# Patient Record
Sex: Male | Born: 1970 | Race: White | Hispanic: No | Marital: Single | Smoking: Current every day smoker
Health system: Southern US, Community
[De-identification: ages and names within clinical notes are randomized; demographics above are authoritative.]

## PROBLEM LIST (undated history)

## (undated) DIAGNOSIS — I10 Essential (primary) hypertension: Secondary | ICD-10-CM

## (undated) DIAGNOSIS — K219 Gastro-esophageal reflux disease without esophagitis: Secondary | ICD-10-CM

## (undated) HISTORY — DX: Essential (primary) hypertension: I10

## (undated) HISTORY — DX: Gastro-esophageal reflux disease without esophagitis: K21.9

---

## 2020-03-04 ENCOUNTER — Encounter: Payer: Self-pay | Admitting: Orthopaedic Surgery

## 2020-03-04 ENCOUNTER — Other Ambulatory Visit: Payer: Self-pay

## 2020-03-04 ENCOUNTER — Ambulatory Visit (INDEPENDENT_AMBULATORY_CARE_PROVIDER_SITE_OTHER): Payer: BC Managed Care – PPO | Admitting: Orthopaedic Surgery

## 2020-03-04 DIAGNOSIS — M25551 Pain in right hip: Secondary | ICD-10-CM
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
## 2020-04-21 ENCOUNTER — Encounter: Payer: Self-pay | Admitting: Orthopaedic Surgery

## 2020-04-21 ENCOUNTER — Other Ambulatory Visit: Payer: Self-pay

## 2020-04-21 ENCOUNTER — Ambulatory Visit (INDEPENDENT_AMBULATORY_CARE_PROVIDER_SITE_OTHER): Payer: BC Managed Care – PPO | Admitting: Orthopaedic Surgery

## 2020-04-21 VITALS — Ht 69.0 in | Wt 215.0 lb

## 2020-04-21 DIAGNOSIS — M25551 Pain in right hip: Secondary | ICD-10-CM

## 2020-04-21 NOTE — Progress Notes (Signed)
   Office Visit Note   Patient: Brian Gomez           Date of Birth: June 27, 1971           MRN: [id number] Visit Date: 04/21/2020              Requested by: Neale Burly, MD [place],  [place] [id number] PCP: Neale Burly, MD   Assessment & Plan: Visit Diagnoses:  1. Pain in right hip     Plan: Persistent pain in the right groin with prior x-rays demonstrating some early degenerative changes of the right hip.  Brian Gomez relates that the pain is interfering with his activities of daily living.  Had seen Dr. Lorin Mercy in March with a suggestion of an MRI scan of his right hip if his pain persist.  Will obtain the MRI scan and have him follow-up with Dr. Lorin Mercy.  I agree with Dr. Lorin Mercy that the problem seems to originate in his hip  Follow-Up Instructions: Return After MRI scan right hip.   Orders:  No orders of the defined types were placed in this encounter.  No orders of the defined types were placed in this encounter.     Procedures: No procedures performed   Clinical Data: No additional findings.   Subjective: Chief Complaint  Patient presents with  . Right Hip - Pain  Patient presents today for right hip pain. No known injury. He said that he has been having pain for six years. Worsening. His pain is located in his groin. He said that his hip will give way. He also states that he has some numbness and tingling in his thigh. He does have some lower back pain. He saw Dr.Yates on  03/04/2020 and states that he has not improved since that visit. He has pain that remains the same with rest or weightbearing. He is not taking anything for pain. He has had x-rays done prior to his visit with Dr.Yates at an urgent care. HPI  Review of Systems   Objective: Vital Signs: Ht 5\' 9"  (1.753 m)   Wt 215 lb (97.5 kg)   BMI 31.75 kg/m   Physical Exam  Ortho Exam awake alert and oriented x3.  Comfortable sitting.  Definite decreased range of motion of right hip compared  to the left but particularly with internal rotation.  Does have pain on the extreme of internal and external rotation.  No distal edema.  No pain with range of motion of the left hip.  Neurologically intact.  Has history of back pain but straight leg raise is negative and no percussible tenderness of his hip. Specialty Comments:  No specialty comments available.  Imaging: No results found.   PMFS History: Patient Active Problem List   Diagnosis Date Noted  . Pain in right hip 03/04/2020   Past Medical History:  Diagnosis Date  . GERD (gastroesophageal reflux disease)   . Hypertension     History reviewed. No pertinent family history.  History reviewed. No pertinent surgical history. Social History   Occupational History  . Not on file  Tobacco Use  . Smoking status: Current Every Day Smoker  . Smokeless tobacco: Never Used  Substance and Sexual Activity  . Alcohol use: Yes  . Drug use: Not on file  . Sexual activity: Not on file

## 2020-04-27 ENCOUNTER — Other Ambulatory Visit: Payer: Self-pay

## 2020-04-27 ENCOUNTER — Ambulatory Visit: Payer: BC Managed Care – PPO | Attending: Internal Medicine

## 2020-04-27 DIAGNOSIS — Z20822 Contact with and (suspected) exposure to covid-19: Secondary | ICD-10-CM

## 2020-04-28 LAB — SARS-COV-2, NAA 2 DAY TAT

## 2020-04-28 LAB — NOVEL CORONAVIRUS, NAA: SARS-CoV-2, NAA: NOT DETECTED

## 2020-05-20 ENCOUNTER — Telehealth: Payer: Self-pay | Admitting: Orthopaedic Surgery

## 2020-05-20 NOTE — Telephone Encounter (Signed)
Dr Lorin Mercy is out of the office. I called Out of work for the last 3 weeks. He states he dropped of papers at the [facility] office for his STD Patient is under doctors care and is out of work.

## 2020-05-20 NOTE — Telephone Encounter (Signed)
Pt called stating he needed Dr. Lorin Mercy to call him back regarding his work status.   [phone]

## 2020-05-20 NOTE — Telephone Encounter (Signed)
I spoke with Baldo Ash, she has the forms and is willing to fill them out however but needing clarification from provider.  Patient has been out of work for 3 weeks. Per notes in chart he was never taken out of work by Dr Lorin Mercy or by Dr Durward Fortes, only reduced hours. Per Baldo Ash, patients job would not accomodate these restrictions and told patient he would not be able to work until released to full duty.   Patient is scheduled for MRI on June 8 at [facility].  Not sure what Dr Lorin Mercy wants to do. I advised patient we would discuss with Dr Lorin Mercy and call him back to advise.

## 2020-05-24 NOTE — Telephone Encounter (Signed)
Please see below and advise. Patient originally saw you in the [place] office in March. He was seen again by Dr. Durward Fortes in May and a MRI was ordered. Dr. Durward Fortes wanted him to follow up with you. He was given a note with restrictions by Dr. Durward Fortes, however, his company cannot accommodate those. OK for out of work until follow up appt for MRI review?

## 2020-05-25 ENCOUNTER — Ambulatory Visit (HOSPITAL_COMMUNITY): Payer: BC Managed Care – PPO

## 2020-05-25 NOTE — Telephone Encounter (Signed)
I called and spoke with Brian Gomez in [place] office. She will have Brian Gomez fill out forms with patient being out of work until follow up in the office for MRI review.

## 2020-05-25 NOTE — Telephone Encounter (Signed)
Yes OK thank you. MRI scheduled today

## 2020-05-26 ENCOUNTER — Other Ambulatory Visit: Payer: Self-pay

## 2020-05-26 ENCOUNTER — Ambulatory Visit (HOSPITAL_COMMUNITY)
Admission: RE | Admit: 2020-05-26 | Discharge: 2020-05-26 | Disposition: A | Discharge status: Home / Self Care | Payer: BC Managed Care – PPO | Source: Ambulatory Visit | Attending: Orthopaedic Surgery | Admitting: Orthopaedic Surgery

## 2020-05-26 DIAGNOSIS — M25551 Pain in right hip: Secondary | ICD-10-CM | POA: Diagnosis present

## 2020-06-02 ENCOUNTER — Ambulatory Visit: Payer: BC Managed Care – PPO | Admitting: Orthopaedic Surgery

## 2020-06-03 ENCOUNTER — Other Ambulatory Visit: Payer: Self-pay

## 2020-06-03 ENCOUNTER — Ambulatory Visit (INDEPENDENT_AMBULATORY_CARE_PROVIDER_SITE_OTHER): Payer: BC Managed Care – PPO | Admitting: Orthopaedic Surgery

## 2020-06-03 ENCOUNTER — Encounter: Payer: Self-pay | Admitting: Orthopaedic Surgery

## 2020-06-03 VITALS — Ht 69.0 in | Wt 215.0 lb

## 2020-06-03 DIAGNOSIS — M25551 Pain in right hip: Secondary | ICD-10-CM | POA: Diagnosis not present

## 2020-06-03 NOTE — Progress Notes (Signed)
Office Visit Note   Patient: Brian Gomez           Date of Birth: Dec 10, 1971           MRN: [id number] Visit Date: 06/03/2020              Requested by: Neale Burly, MD [place],  [place] [id number] PCP: Neale Burly, MD   Assessment & Plan: Visit Diagnoses: No diagnosis found.  Plan: We will set patient up for work resumption on 06/12/2020 without restrictions.  I plan to recheck him in 4 months we discussed eventually he may require total hip arthroplasty.  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  Currently has maintained good joint space on previous radiographs.  If he is having increased symptoms on return we will obtain standing AP x-rays both hips weightbearing film.  Follow-Up Instructions: Return in about 4 months (around 10/03/2020).   Orders:  No orders of the defined types were placed in this encounter.  No orders of the defined types were placed in this encounter.     Procedures: No procedures performed   Clinical Data: No additional findings.   Subjective: Chief Complaint  Patient presents with  . Right Hip - Pain, Follow-up    MRI Review    HPI 49 year old male returns with ongoing problems with pain in his right hip.  MRI scan has been obtained which shows some degenerative changes with acetabular cysts several anteriorly.  There is no evidence of labral tear no evidence of AVN.  He states not working and is doing better.  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  Review of Systems all systems are negative other than as mentioned HPI.   Objective: Vital Signs: Ht 5\' 9"  (1.753 m)   Wt 215 lb (97.5 kg)   BMI 31.75 kg/m   Physical Exam Constitutional:      Appearance: He is well-developed.  HENT:     Head: Normocephalic and atraumatic.  Eyes:     Pupils: Pupils are equal, round, and reactive to light.  Neck:     Thyroid: No thyromegaly.     Trachea: No tracheal  deviation.  Cardiovascular:     Rate and Rhythm: Normal rate.  Pulmonary:     Effort: Pulmonary effort is normal.     Breath sounds: No wheezing.  Abdominal:     General: Bowel sounds are normal.     Palpations: Abdomen is soft.  Skin:    General: Skin is warm and dry.     Capillary Refill: Capillary refill takes less than 2 seconds.  Neurological:     Mental Status: He is alert and oriented to person, place, and time.  Psychiatric:        Behavior: Behavior normal.        Thought Content: Thought content normal.        Judgment: Judgment normal.     Ortho Exam patient has mild discomfort with internal/external rotation against [place] from sitting to standing ambulates without limp.  Specialty Comments:  No specialty comments available.  Imaging: No results found.   PMFS History: Patient Active Problem List   Diagnosis Date Noted  . Pain in right hip 03/04/2020   Past Medical History:  Diagnosis Date  . GERD (gastroesophageal reflux disease)   . Hypertension     No family history on file.  No past surgical history  on file. Social History   Occupational History  . Not on file  Tobacco Use  . Smoking status: Current Every Day Smoker  . Smokeless tobacco: Never Used  Substance and Sexual Activity  . Alcohol use: Yes  . Drug use: Not on file  . Sexual activity: Not on file

## 2020-06-07 ENCOUNTER — Telehealth: Payer: Self-pay | Admitting: Radiology

## 2020-06-07 NOTE — Telephone Encounter (Signed)
Please advise.  Brian Gomez needs a note to stay out until July 3. Is that possible? He has not received a disability check yet and cannot afford to go back to work. Please call and let him know.  [phone]

## 2020-06-08 NOTE — Telephone Encounter (Signed)
I called discussed. Already sent in to W/C my office note. He can recall short term disablily co about getting that check. RTW as planned.

## 2020-06-08 NOTE — Telephone Encounter (Signed)
noted 

## 2020-06-24 ENCOUNTER — Ambulatory Visit: Payer: BC Managed Care – PPO | Admitting: Orthopaedic Surgery

## 2020-06-24 ENCOUNTER — Other Ambulatory Visit: Payer: Self-pay

## 2020-07-30 ENCOUNTER — Telehealth: Payer: Self-pay | Admitting: Radiology

## 2020-07-30 ENCOUNTER — Telehealth: Payer: Self-pay | Admitting: Orthopaedic Surgery

## 2020-07-30 NOTE — Telephone Encounter (Signed)
Received voicemail from patient requesting paperwork be faxed to [facility] today for FMLA. See other message in chart. Tammy has already left patient a voicemail in regards to message.

## 2020-07-30 NOTE — Telephone Encounter (Signed)
Received vm from pt. Stating needs paperwork to [facility]. IC,lmvm. Advised we have not received anything new from [facility] and he has not been seen since June, so there is nothing new to send them and he is to have been back to work. I advised he may need to schedule an appt.

## 2020-08-04 ENCOUNTER — Telehealth: Payer: Self-pay | Admitting: Orthopaedic Surgery

## 2020-08-04 NOTE — Telephone Encounter (Signed)
I left voicemail for patient. Asked for him to return call and be more specific in message. I did explain that I am working with a different provider this afternoon and will have to return call to him.

## 2020-08-04 NOTE — Telephone Encounter (Signed)
Pt called stating he would like a CB but did not say what it would be in regards to.   [phone]

## 2020-08-09 ENCOUNTER — Telehealth: Payer: Self-pay | Admitting: Orthopaedic Surgery

## 2020-08-09 NOTE — Telephone Encounter (Signed)
Patient called, spoke with him. He has scheduled an appt with Dr. Lorin Mercy for 9/2. I advised him we did receive new paperwork from [employer] however, they cannot be completed until after he has seen Dr. Lorin Mercy. He understands this and also explained that we use [employer] to fill out our forms

## 2020-08-19 ENCOUNTER — Ambulatory Visit: Payer: Self-pay

## 2020-08-19 ENCOUNTER — Other Ambulatory Visit: Payer: Self-pay

## 2020-08-19 ENCOUNTER — Encounter: Payer: Self-pay | Admitting: Orthopaedic Surgery

## 2020-08-19 ENCOUNTER — Ambulatory Visit (INDEPENDENT_AMBULATORY_CARE_PROVIDER_SITE_OTHER): Payer: BC Managed Care – PPO | Admitting: Orthopaedic Surgery

## 2020-08-19 VITALS — Ht 69.0 in | Wt 215.0 lb

## 2020-08-19 DIAGNOSIS — M25551 Pain in right hip: Secondary | ICD-10-CM

## 2020-08-19 DIAGNOSIS — M1611 Unilateral primary osteoarthritis, right hip: Secondary | ICD-10-CM | POA: Diagnosis not present

## 2020-08-19 NOTE — Progress Notes (Signed)
Office Visit Note   Patient: Brian Gomez           Date of Birth: 11-15-1971           MRN: [id number] Visit Date: 08/19/2020              Requested by: Neale Burly, MD [place],  [place] [id number] PCP: Neale Burly, MD   Assessment & Plan: Visit Diagnoses:  1. Pain in right hip   2. Unilateral primary osteoarthritis, right hip     Plan: Patient will return in 2 weeks for right hip intra-articular injection using 22-gauge spinal needle direct anterior approach supine position.  This would be both diagnostic and therapeutic.  We discussed possible total of arthroplasty if his symptoms progress.  Previous MRI was reviewed with him today as well as recommendations.  Work slip given for [employer] for some intermittent leave possibly 1 to 3 days/month.  Follow-Up Instructions: Return in about 2 weeks (around 09/02/2020).   Orders:  Orders Placed This Encounter  Procedures  . XR HIP UNILAT W OR W/O PELVIS 2-3 VIEWS RIGHT   No orders of the defined types were placed in this encounter.     Procedures: No procedures performed   Clinical Data: No additional findings.   Subjective: Chief Complaint  Patient presents with  . Right Hip - Follow-up, Pain    HPI 49 year old male returns with progressive right hip catching sharp pain in the anterior groin he states his leg tends to give way.  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  He is requesting form for intermittent leave.  Previous MRI showed some cyst anteriorly in the acetabulum in the weightbearing portion slightly more anterior that may be in the critical weightbearing zone.  Review of Systems 14 point system update updated unchanged from 06/03/2020 other than as mentioned HPI.   Objective: Vital  Signs: Ht 5\' 9"  (1.753 m)   Wt 215 lb (97.5 kg)   BMI 31.75 kg/m   Physical Exam Constitutional:      Appearance: He is well-developed.  HENT:     Head: Normocephalic and atraumatic.  Eyes:     Pupils: Pupils are equal, round, and reactive to light.  Neck:     Thyroid: No thyromegaly.     Trachea: No tracheal deviation.  Cardiovascular:     Rate and Rhythm: Normal rate.  Pulmonary:     Effort: Pulmonary effort is normal.     Breath sounds: No wheezing.  Abdominal:     General: Bowel sounds are normal.     Palpations: Abdomen is soft.  Skin:    General: Skin is warm and dry.     Capillary Refill: Capillary refill takes less than 2 seconds.  Neurological:     Mental Status: He is alert and oriented to person, place, and time.  Psychiatric:        Behavior: Behavior normal.        Thought Content: Thought content normal.        Judgment: Judgment normal.     Ortho Exam patient has pain with internal/external rotation of his hip mild to moderate at 30 degrees.  No hip flexion contracture.  Amatory with a slight Trendelenburg gait.  Normal  knee range of motion distal pulses are 2 popliteal compression test no sciatic notch tenderness. Specialty Comments:  No specialty comments available.  Imaging: No results found.   PMFS History: Patient Active Problem List   Diagnosis Date Noted  . Unilateral primary osteoarthritis, right hip 08/19/2020  . Pain in right hip 03/04/2020   Past Medical History:  Diagnosis Date  . GERD (gastroesophageal reflux disease)   . Hypertension     No family history on file.  No past surgical history on file. Social History   Occupational History  . Not on file  Tobacco Use  . Smoking status: Current Every Day Smoker  . Smokeless tobacco: Never Used  Substance and Sexual Activity  . Alcohol use: Yes  . Drug use: Not on file  . Sexual activity: Not on file

## 2020-09-02 ENCOUNTER — Ambulatory Visit (INDEPENDENT_AMBULATORY_CARE_PROVIDER_SITE_OTHER): Payer: BC Managed Care – PPO | Admitting: Orthopaedic Surgery

## 2020-09-02 ENCOUNTER — Other Ambulatory Visit: Payer: Self-pay

## 2020-09-02 ENCOUNTER — Encounter: Payer: Self-pay | Admitting: Orthopaedic Surgery

## 2020-09-02 VITALS — BP 169/90 | Ht 69.0 in | Wt 217.0 lb

## 2020-09-02 DIAGNOSIS — M1611 Unilateral primary osteoarthritis, right hip: Secondary | ICD-10-CM

## 2020-09-02 MED ORDER — METHYLPREDNISOLONE ACETATE 40 MG/ML IJ SUSP
40.0000 mg | INTRAMUSCULAR | Status: AC | PRN
  Administered 2020-09-02: 40 mg via INTRA_ARTICULAR

## 2020-09-02 MED ORDER — LIDOCAINE HCL 1 % IJ SOLN
0.5000 mL | INTRAMUSCULAR | Status: AC | PRN
  Administered 2020-09-02: .5 mL

## 2020-09-02 MED ORDER — BUPIVACAINE HCL 0.25 % IJ SOLN
2.0000 mL | INTRAMUSCULAR | Status: AC | PRN
  Administered 2020-09-02: 2 mL via INTRA_ARTICULAR

## 2020-09-02 NOTE — Progress Notes (Signed)
Office Visit Note   Patient: Brian Gomez           Date of Birth: June 03, 1971           MRN: [id number] Visit Date: 09/02/2020              Requested by: Neale Burly, MD [place],  [place] [id number] PCP: Neale Burly, MD   Assessment & Plan: Visit Diagnoses:  1. Unilateral primary osteoarthritis, right hip     Plan: Right hip injection performed he noted some relief with the injection.  No Trendelenburg gait post injection I will recheck him in 2 months.  Follow-Up Instructions: Return in about 2 months (around 11/02/2020).   Orders:  Orders Placed This Encounter  Procedures  . Large Joint Inj   No orders of the defined types were placed in this encounter.     Procedures: Large Joint Inj: R hip joint on 09/02/2020 11:53 AM Details: anterior approach Medications: 0.5 mL lidocaine 1 %; 40 mg methylPREDNISolone acetate 40 MG/ML; 2 mL bupivacaine 0.25 %      Clinical Data: No additional findings.   Subjective: Chief Complaint  Patient presents with  . Right Hip - Pain    HPI 49 year old male returns with moderate right hip osteoarthritis for planned intra-articular right hip injection.  I saw him 2 weeks ago but due to some scheduled activities he had he could not get the injection at that time.  Review of Systems updated unchanged from last visit.   Objective: Vital Signs: BP (!) 169/90   Ht 5\' 9"  (1.753 m)   Wt 217 lb (98.4 kg)   BMI 32.05 kg/m   Physical Exam Constitutional:      Appearance: He is well-developed.  HENT:     Head: Normocephalic and atraumatic.  Eyes:     Pupils: Pupils are equal, round, and reactive to light.  Neck:     Thyroid: No thyromegaly.     Trachea: No tracheal deviation.  Cardiovascular:     Rate and Rhythm: Normal rate.  Pulmonary:     Effort: Pulmonary effort is normal.     Breath sounds: No wheezing.  Abdominal:     General: Bowel sounds are normal.     Palpations: Abdomen is soft.  Skin:     General: Skin is warm and dry.     Capillary Refill: Capillary refill takes less than 2 seconds.  Neurological:     Mental Status: He is alert and oriented to person, place, and time.  Psychiatric:        Behavior: Behavior normal.        Thought Content: Thought content normal.        Judgment: Judgment normal.     Ortho Exam mild Trendelenburg limp.  Mild sciatic notch tenderness on the right.  Reproduction of pain with internal rotation 30 degrees right hip.  No hip flexion contracture knee reaches full extension distal pulses are intact. Specialty Comments:  No specialty comments available.  Imaging: No results found.   PMFS History: Patient Active Problem List   Diagnosis Date Noted  . Unilateral primary osteoarthritis, right hip 08/19/2020  . Pain in right hip 03/04/2020   Past Medical History:  Diagnosis Date  . GERD (gastroesophageal reflux disease)   . Hypertension     No family history on file.  No past surgical history on file. Social History   Occupational History  . Not on file  Tobacco Use  .  Smoking status: Current Every Day Smoker  . Smokeless tobacco: Never Used  Substance and Sexual Activity  . Alcohol use: Yes  . Drug use: Not on file  . Sexual activity: Not on file

## 2020-10-18 ENCOUNTER — Ambulatory Visit (INDEPENDENT_AMBULATORY_CARE_PROVIDER_SITE_OTHER): Payer: BC Managed Care – PPO | Admitting: Clinical

## 2020-10-18 ENCOUNTER — Other Ambulatory Visit: Payer: Self-pay

## 2020-10-18 DIAGNOSIS — F4310 Post-traumatic stress disorder, unspecified: Secondary | ICD-10-CM

## 2020-10-18 DIAGNOSIS — F332 Major depressive disorder, recurrent severe without psychotic features: Secondary | ICD-10-CM
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
## 2020-11-04 ENCOUNTER — Encounter: Payer: Self-pay | Admitting: Orthopaedic Surgery

## 2020-11-04 ENCOUNTER — Other Ambulatory Visit: Payer: Self-pay

## 2020-11-04 ENCOUNTER — Ambulatory Visit (INDEPENDENT_AMBULATORY_CARE_PROVIDER_SITE_OTHER): Payer: BC Managed Care – PPO | Admitting: Orthopaedic Surgery

## 2020-11-04 VITALS — BP 148/89 | HR 82 | Ht 69.0 in | Wt 210.0 lb

## 2020-11-04 DIAGNOSIS — M25551 Pain in right hip: Secondary | ICD-10-CM | POA: Diagnosis not present

## 2020-11-04 NOTE — Progress Notes (Signed)
Office Visit Note   Patient: Brian Gomez           Date of Birth: 17-Jan-1971           MRN: [id number] Visit Date: 11/04/2020              Requested by: Neale Burly, MD [place],  [place] [id number] PCP: Neale Burly, MD   Assessment & Plan: Visit Diagnoses:  1. Pain in right hip     Plan: We reviewed the findings on his MRI which showed some mild hip osteoarthritis.  Some of this pain may be related to his lumbar spine but current symptoms are not severe enough to consider MRI of the lumbar spine.  Reflexes are intact he has good strength and is amatory.  He can continue the Celebrex continue gentle stretching and walking program.  Follow-Up Instructions: No follow-ups on file.   Orders:  No orders of the defined types were placed in this encounter.  No orders of the defined types were placed in this encounter.     Procedures: No procedures performed   Clinical Data: No additional findings.   Subjective: Chief Complaint  Patient presents with  . Right Hip - Follow-up, Pain    HPI 49 year old male returns he states he intra-articular hip injection 09/02/2020 gave him some relief for a few days.  MRI scan ordered by Dr. Durward Fortes just showed some mild hip osteoarthritis which is fairly symmetrical.  He is amatory without limping sometimes he has noticed some numbness in his opposite left foot mostly into the big toe.  Occasionally has had back discomfort.  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  Review of Systems positive for PTSD recently started some new medications.   Objective: Vital Signs: BP (!) 148/89   Pulse 82   Ht 5\' 9"  (1.753 m)   Wt 210 lb (95.3 kg)   BMI 31.01 kg/m   Physical Exam Constitutional:      Appearance: He is well-developed.  HENT:     Head: Normocephalic and atraumatic.  Eyes:      Pupils: Pupils are equal, round, and reactive to light.  Neck:     Thyroid: No thyromegaly.     Trachea: No tracheal deviation.  Cardiovascular:     Rate and Rhythm: Normal rate.  Pulmonary:     Effort: Pulmonary effort is normal.     Breath sounds: No wheezing.  Abdominal:     General: Bowel sounds are normal.     Palpations: Abdomen is soft.  Skin:    General: Skin is warm and dry.     Capillary Refill: Capillary refill takes less than 2 seconds.  Neurological:     Mental Status: He is alert and oriented to person, place, and time.  Psychiatric:        Behavior: Behavior normal.        Thought Content: Thought content normal.        Judgment: Judgment normal.     Ortho Exam normal gait negative Trendelenburg gait.  Negative logroll to the hips.  No atrophy.  Specialty Comments:  No specialty comments available.  Imaging: No results found.   PMFS History: Patient Active Problem List   Diagnosis Date Noted  . Unilateral primary osteoarthritis, right hip 08/19/2020  . Pain  in right hip 03/04/2020   Past Medical History:  Diagnosis Date  . GERD (gastroesophageal reflux disease)   . Hypertension     No family history on file.  No past surgical history on file. Social History   Occupational History  . Not on file  Tobacco Use  . Smoking status: Current Every Day Smoker  . Smokeless tobacco: Never Used  Substance and Sexual Activity  . Alcohol use: Yes  . Drug use: Not on file  . Sexual activity: Not on file

## 2020-11-09 ENCOUNTER — Other Ambulatory Visit: Payer: Self-pay

## 2020-11-09 ENCOUNTER — Ambulatory Visit (INDEPENDENT_AMBULATORY_CARE_PROVIDER_SITE_OTHER): Payer: BC Managed Care – PPO | Admitting: Clinical

## 2020-11-09 DIAGNOSIS — F4310 Post-traumatic stress disorder, unspecified: Secondary | ICD-10-CM | POA: Diagnosis not present

## 2020-11-09 DIAGNOSIS — F332 Major depressive disorder, recurrent severe without psychotic features: Secondary | ICD-10-CM | POA: Diagnosis not present

## 2020-11-09 NOTE — Progress Notes (Signed)
Virtual Visit via Telephone Note  I connected with Kandis Fantasia on 11/09/20 at  2:00 PM EST by telephone and verified that I am speaking with the correct person using two identifiers.  Location: Patient: Home Provider: Office   I discussed the limitations, risks, security and privacy concerns of performing an evaluation and management service by telephone and the availability of in person appointments. I also discussed with the patient that there may be a patient responsible charge related to this service. The patient expressed understanding and agreed to proceed.   THERAPIST PROGRESS NOTE  Session Time:2:10PM-2:30PM  Participation Level:Active  Behavioral Response:Casual,Alert, Irratible   Type of Therapy:Individual Therapy  Treatment Goals addressed:CopingAnger Management  Interventions:CBT, Motivational Interviewing, Solution Focused and Supportive  Summary:Rashaun Haynesis a 49 y.o.malewho presents with Depression and PTSD .The OPT therapist worked with thepatientfor his initialsession. The OPT therapist utilized Motivational Interviewing to assist in creating therapeutic repore. The patient in the session was engaged and work in collaboration giving feedback about his triggers and symptoms over the past few weeksincluding feeling overwhelmed in working 12hr shifts. The OPT therapist utilized Cognitive Behavioral Therapy through cognitive restructuring as well as worked with the patient on coping strategies to assist in management of mood. The OPT therapist review continuing to track the patients trajectory.    Suicidal/Homicidal:Nowithout intent/plan  Therapist Response:The OPT therapist worked with the patient for the patients scheduled session. The patient was engaged in his session and gave feedback in relation to triggers, symptoms, and behavior responses over the pastfewweeks. The OPT therapist worked with the patient utilizing an in session  Cognitive Behavioral Therapy exercise. The patient was responsive in the session and verbalized, " Janeal Holmes started taking Paxil I am just going to be consistent and wait a few weeks to see how it works".The OPT therapist worked with the patient getting feedback about his efforts to avoid conflict with others. The OPT therapist gave the patient praise for his improvements and encouraged ongoing work to sustain and continue to improvement in his health trajectory. The OPT therapist will continue treatment work with the patient in his next scheduled session.   Plan: Return again in2/3weeks.  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 The patient was provided an opportunity to ask questions and all were answered. The patient agreed with the plan and demonstrated an understanding of the instructions.  The patient was advised to call back or seek an in-person evaluation if the symptoms worsen or if the condition fails to improve as anticipated.  I provided53minutes of non-face-to-face time during this encounter.  Lennox Grumbles, LCSW 11/09/2020

## 2020-12-06 ENCOUNTER — Ambulatory Visit (HOSPITAL_COMMUNITY): Payer: BC Managed Care – PPO | Admitting: Clinical

## 2020-12-28 ENCOUNTER — Ambulatory Visit (HOSPITAL_COMMUNITY): Payer: BC Managed Care – PPO | Admitting: Psychiatry

## 2020-12-28 ENCOUNTER — Other Ambulatory Visit: Payer: Self-pay

## 2020-12-28 ENCOUNTER — Telehealth (HOSPITAL_COMMUNITY): Payer: Self-pay | Admitting: Psychiatry

## 2020-12-28 NOTE — Telephone Encounter (Signed)
Therapist attempted to contact patient twice via text through [place] for scheduled appointment.  Therapist called patient but was unable to leave a message due to recording from voicemail message.

## 2020-12-31 ENCOUNTER — Ambulatory Visit (HOSPITAL_COMMUNITY): Payer: BC Managed Care – PPO | Admitting: Psychiatry

## 2020-12-31 ENCOUNTER — Other Ambulatory Visit: Payer: Self-pay

## 2020-12-31 ENCOUNTER — Telehealth (HOSPITAL_COMMUNITY): Payer: Self-pay | Admitting: Psychiatry

## 2020-12-31 NOTE — Telephone Encounter (Signed)
Therapist attempted to contact patient twice through caregility platform for scheduled appointment, no response. Therapist called patient and received voicemail message mailbox has not been established.

## 2021-02-03 ENCOUNTER — Ambulatory Visit: Payer: BC Managed Care – PPO | Admitting: Orthopaedic Surgery

## 2021-02-03 ENCOUNTER — Other Ambulatory Visit: Payer: Self-pay

## 2021-02-10 ENCOUNTER — Encounter: Payer: Self-pay | Admitting: Orthopaedic Surgery

## 2021-02-10 ENCOUNTER — Ambulatory Visit (INDEPENDENT_AMBULATORY_CARE_PROVIDER_SITE_OTHER): Payer: BC Managed Care – PPO | Admitting: Orthopaedic Surgery

## 2021-02-10 ENCOUNTER — Other Ambulatory Visit: Payer: Self-pay

## 2021-02-10 VITALS — Ht 69.0 in | Wt 210.0 lb

## 2021-02-10 DIAGNOSIS — M25551 Pain in right hip: Secondary | ICD-10-CM | POA: Diagnosis not present

## 2021-02-10 DIAGNOSIS — M1611 Unilateral primary osteoarthritis, right hip: Secondary | ICD-10-CM | POA: Diagnosis not present
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
## 2021-02-14 ENCOUNTER — Telehealth: Payer: Self-pay | Admitting: Radiology

## 2021-02-14 NOTE — Telephone Encounter (Signed)
[place], [place], [place], [place] would like to have a work note for Thursday. If you can put it in the computer I will print and give to him. He wants to come pick it up on Tuesday   Per Dr. Lorin Mercy, ok for note that patient was seen in office that day. Entered into system.

## 2021-02-24 ENCOUNTER — Telehealth: Payer: Self-pay

## 2021-02-24 NOTE — Telephone Encounter (Signed)
I called discussed. He has mild hip OA by MRI.unable to do. He can look for easier job if he does not want to do the standing job etc.

## 2021-02-24 NOTE — Telephone Encounter (Signed)
Patient called asking if he could have a note keeping him out of work starting 02/26/21-03/29/21. Please advise.

## 2021-03-15 ENCOUNTER — Ambulatory Visit (HOSPITAL_COMMUNITY): Payer: BC Managed Care – PPO | Admitting: Psychiatry

## 2021-03-25 ENCOUNTER — Ambulatory Visit (INDEPENDENT_AMBULATORY_CARE_PROVIDER_SITE_OTHER): Payer: BC Managed Care – PPO | Admitting: Psychiatry

## 2021-03-25 ENCOUNTER — Encounter (HOSPITAL_COMMUNITY): Payer: Self-pay | Admitting: Psychiatry

## 2021-03-25 ENCOUNTER — Other Ambulatory Visit: Payer: Self-pay

## 2021-03-25 DIAGNOSIS — F4310 Post-traumatic stress disorder, unspecified: Secondary | ICD-10-CM | POA: Diagnosis not present

## 2021-03-25 DIAGNOSIS — F332 Major depressive disorder, recurrent severe without psychotic features: Secondary | ICD-10-CM
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
## 2021-03-25 NOTE — Progress Notes (Signed)
11.1.23

## 2021-03-28 ENCOUNTER — Telehealth (HOSPITAL_COMMUNITY): Payer: Self-pay | Admitting: Licensed Clinical Social Worker

## 2021-04-07 ENCOUNTER — Other Ambulatory Visit: Payer: Self-pay

## 2021-04-07 ENCOUNTER — Ambulatory Visit (HOSPITAL_COMMUNITY): Payer: BC Managed Care – PPO | Admitting: Psychiatry

## 2021-04-07 ENCOUNTER — Telehealth (HOSPITAL_COMMUNITY): Payer: Self-pay | Admitting: Psychiatry

## 2021-04-07 NOTE — Telephone Encounter (Signed)
Therapist attempted to contact patient twice via text through care agility platform, no response.  Therapist called patient who reported schedule conflict.  Therapist and patient agreed to reschedule and patient agreed to keep next appointment for May 5.

## 2021-04-21 ENCOUNTER — Other Ambulatory Visit: Payer: Self-pay

## 2021-04-21 ENCOUNTER — Ambulatory Visit (INDEPENDENT_AMBULATORY_CARE_PROVIDER_SITE_OTHER): Payer: BC Managed Care – PPO | Admitting: Psychiatry

## 2021-04-21 DIAGNOSIS — F4310 Post-traumatic stress disorder, unspecified: Secondary | ICD-10-CM | POA: Diagnosis not present

## 2021-04-21 DIAGNOSIS — F332 Major depressive disorder, recurrent severe without psychotic features: Secondary | ICD-10-CM
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
## 2021-05-05 ENCOUNTER — Telehealth (HOSPITAL_COMMUNITY): Payer: Self-pay | Admitting: Psychiatry

## 2021-05-05 ENCOUNTER — Ambulatory Visit (HOSPITAL_COMMUNITY): Payer: BC Managed Care – PPO | Admitting: Psychiatry

## 2021-05-05 ENCOUNTER — Other Ambulatory Visit: Payer: Self-pay

## 2021-05-05 NOTE — Telephone Encounter (Signed)
Therapist attempted to contact patient twice via text through Brian Gomez for scheduled appointment, no response.  Therapist called patient, left message indicating attempt, and requesting patient call office.

## 2021-05-23 ENCOUNTER — Other Ambulatory Visit: Payer: Self-pay

## 2021-05-23 ENCOUNTER — Ambulatory Visit (INDEPENDENT_AMBULATORY_CARE_PROVIDER_SITE_OTHER): Payer: BC Managed Care – PPO | Admitting: Psychiatry

## 2021-05-23 DIAGNOSIS — F332 Major depressive disorder, recurrent severe without psychotic features: Secondary | ICD-10-CM | POA: Diagnosis not present

## 2021-05-23 DIAGNOSIS — F4310 Post-traumatic stress disorder, unspecified: Secondary | ICD-10-CM | POA: Diagnosis not present
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
## 2021-06-06 ENCOUNTER — Other Ambulatory Visit: Payer: Self-pay

## 2021-06-06 ENCOUNTER — Ambulatory Visit (HOSPITAL_COMMUNITY): Payer: BC Managed Care – PPO | Admitting: Psychiatry

## 2021-06-17 IMAGING — MR MR HIP*R* W/O CM
4 of 5 series · 21 of 40 positions shown · non-contrast
Comparison: None.

CLINICAL DATA: Intermittent right hip pain for 10 years. No recent
injury.

EXAM:
MR OF THE RIGHT HIP WITHOUT CONTRAST
TECHNIQUE: Multiplanar, multisequence MR imaging was performed. No intravenous
contrast was administered.

[Series 3: t1_tse_cor · coronal · 4.0mm · 0.74mm/px · 9 of 30 slices shown]
[im 1/30]
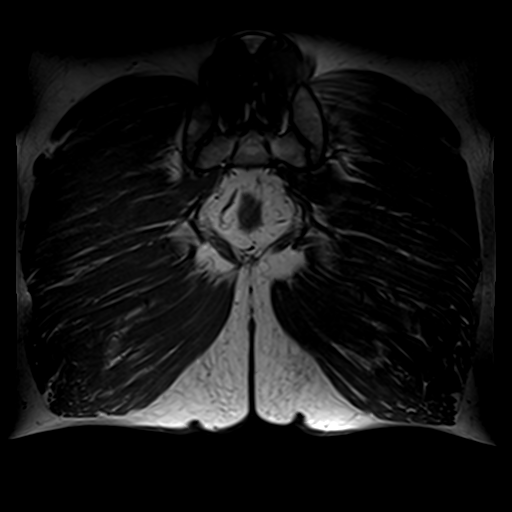
[im 4/30]
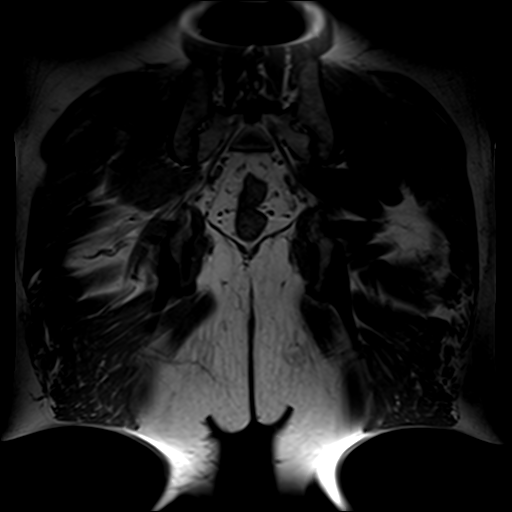
[im 8/30]
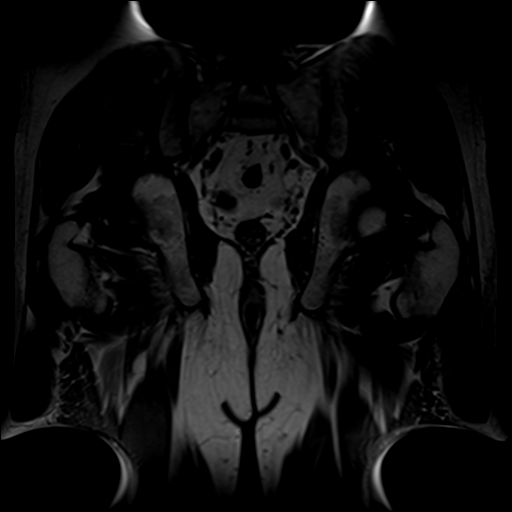
[im 11/30]
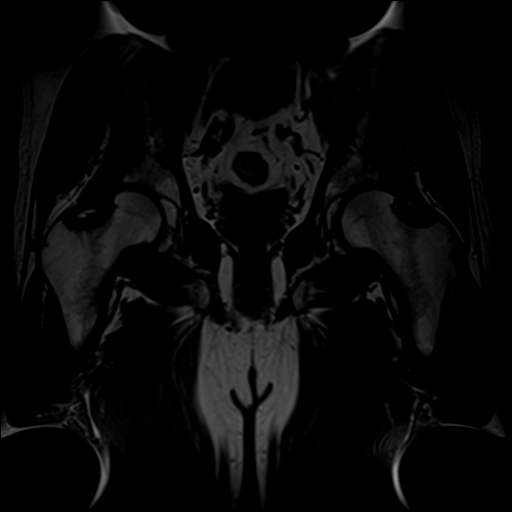
[im 15/30]
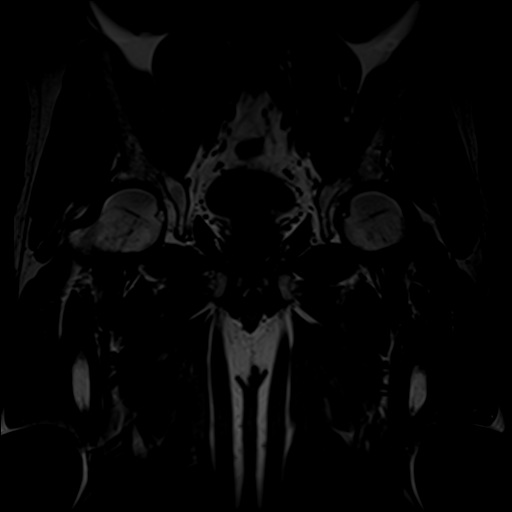
[im 19/30]
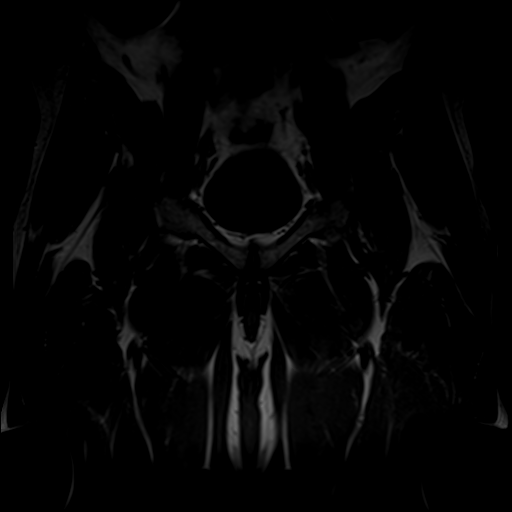
[im 22/30]
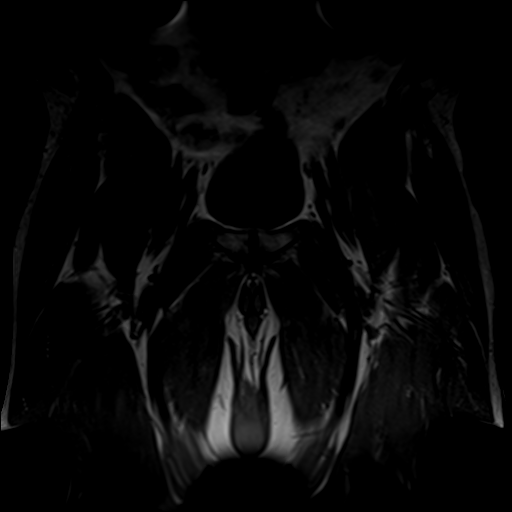
[im 26/30]
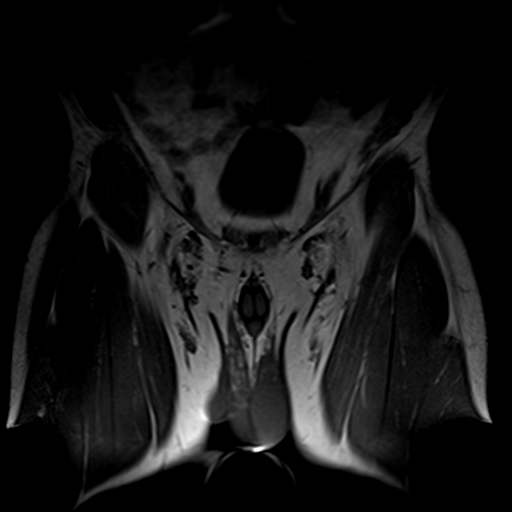
[im 30/30]
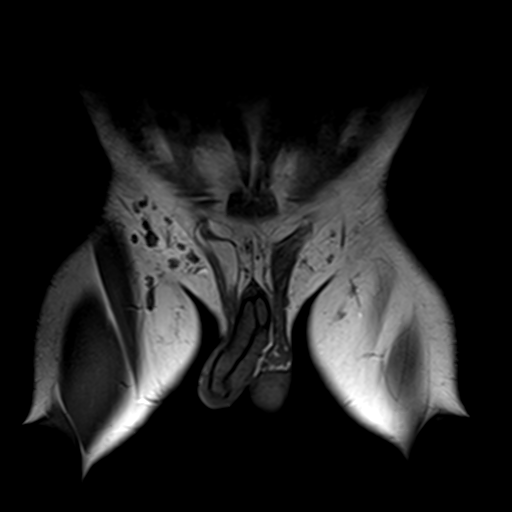

[Series 4: t2fs cor · coronal · 4.0mm · 0.74mm/px · 6 of 30 slices shown]
[im 1/30]
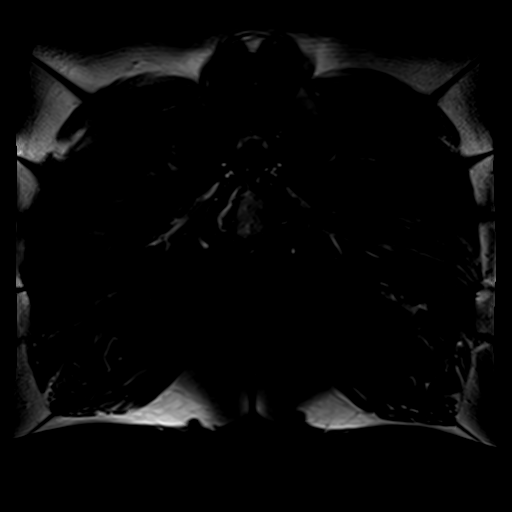
[im 5/30]
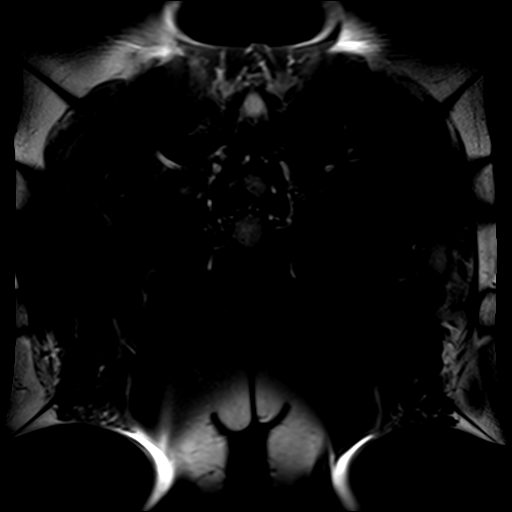
[im 9/30]
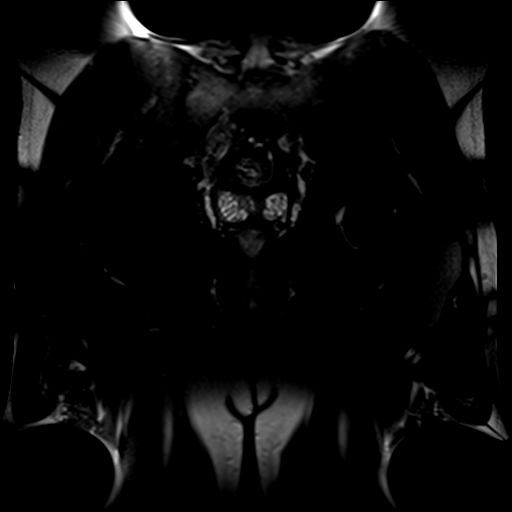
[im 13/30]
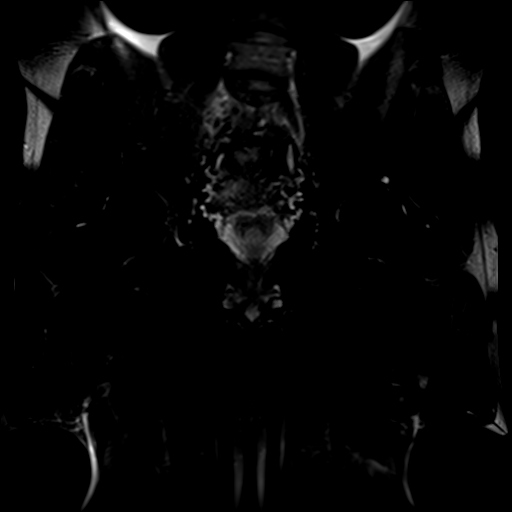
[im 17/30]
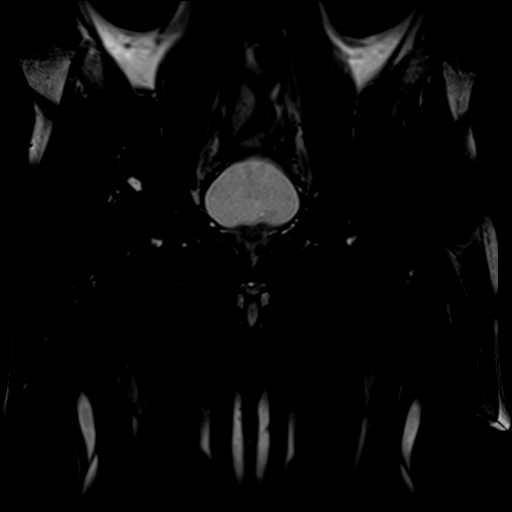
[im 25/30]
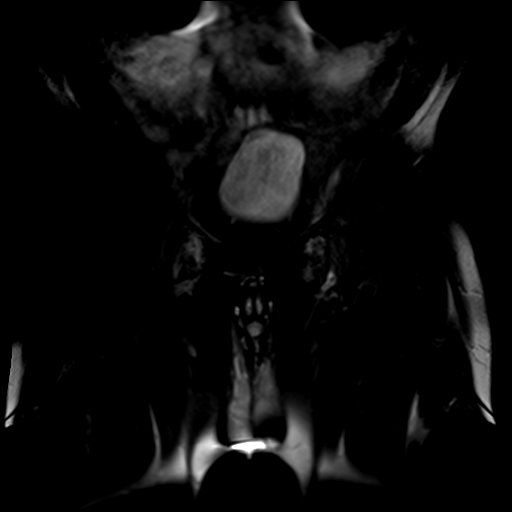

[Series 5: t2fs tra right · axial · 4.0mm · 0.39mm/px · z∈[-15,+85]mm · 3 of 30 slices shown]
[im 5/30]
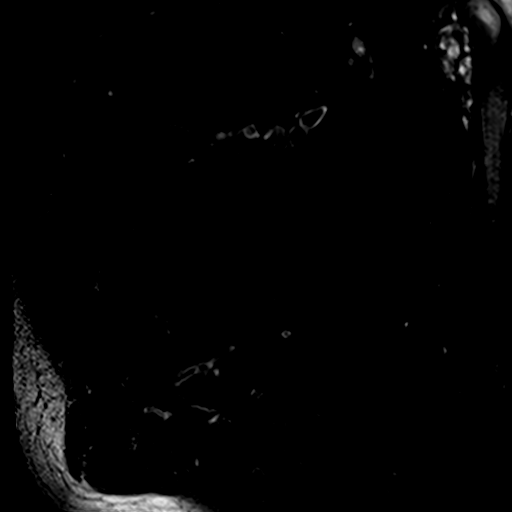
[im 17/30]
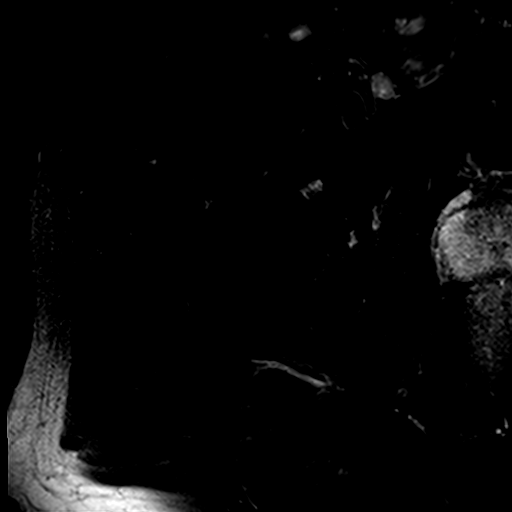
[im 25/30]
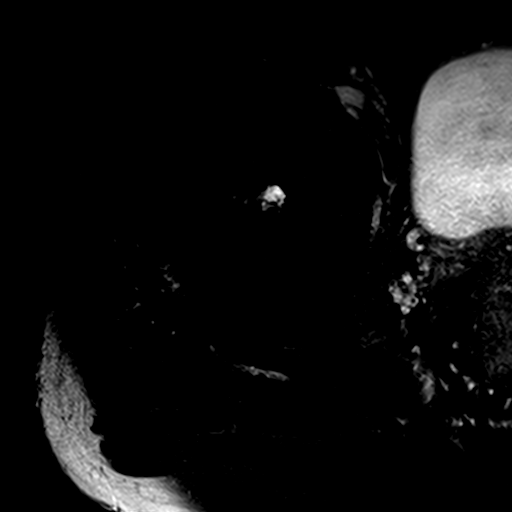

[Series 6: pdfs sag right · sagittal · 3.0mm · 0.86mm/px · 3 of 34 slices shown]
[im 5/34]
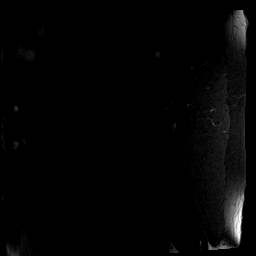
[im 17/34]
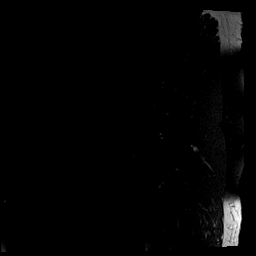
[im 29/34]
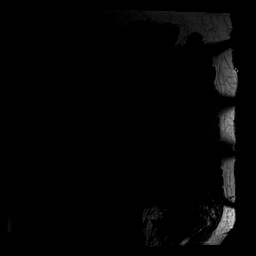

[21 of 40 positions shown; findings below may reference images not displayed]

FINDINGS: Bones: There is no acute bony or joint abnormality. No worrisome
marrow lesion is identified. Two subchondral cysts are seen in the
anterior aspect of the right acetabulum. A small subchondral cyst is
also identified in the left acetabulum.

Articular cartilage and labrum

Articular cartilage:  Appears mildly thinned without focal defect.

Labrum:  Appears intact.

Joint or bursal effusion

Joint effusion:  None.

Bursae: Negative.

Muscles and tendons

Muscles and tendons:  Intact and normal in appearance.

Other findings

Miscellaneous:   Imaged intrapelvic contents are unremarkable.
IMPRESSION: Mild appearing bilateral hip osteoarthritis is more notable on the
right. The exam is otherwise negative.

## 2021-06-21 ENCOUNTER — Other Ambulatory Visit: Payer: Self-pay

## 2021-06-21 ENCOUNTER — Ambulatory Visit (HOSPITAL_COMMUNITY): Payer: BC Managed Care – PPO | Admitting: Psychiatry

## 2021-07-04 ENCOUNTER — Ambulatory Visit (HOSPITAL_COMMUNITY): Payer: BC Managed Care – PPO | Admitting: Psychiatry

## 2021-07-04 ENCOUNTER — Other Ambulatory Visit: Payer: Self-pay

## 2021-07-04 ENCOUNTER — Telehealth (HOSPITAL_COMMUNITY): Payer: Self-pay | Admitting: Psychiatry

## 2021-07-04 NOTE — Telephone Encounter (Signed)
Therapist attempted to contact patient twice via text through [facility] for scheduled appointment, no response.  Therapist called patient, left message indicating attempt, and requesting patient call office.

## 2021-08-03 ENCOUNTER — Encounter: Payer: Self-pay | Admitting: *Deleted

## 2021-10-03 ENCOUNTER — Ambulatory Visit: Payer: BC Managed Care – PPO

## 2021-10-03 ENCOUNTER — Encounter: Payer: Self-pay | Admitting: *Deleted

## 2021-10-19 ENCOUNTER — Other Ambulatory Visit: Payer: Self-pay

## 2021-10-19 ENCOUNTER — Ambulatory Visit (INDEPENDENT_AMBULATORY_CARE_PROVIDER_SITE_OTHER): Payer: Self-pay | Admitting: *Deleted

## 2021-10-19 ENCOUNTER — Encounter: Payer: Self-pay | Admitting: *Deleted

## 2021-10-19 VITALS — Ht 69.0 in | Wt 194.4 lb

## 2021-10-19 DIAGNOSIS — Z1211 Encounter for screening for malignant neoplasm of colon: Secondary | ICD-10-CM

## 2021-10-19 MED ORDER — NA SULFATE-K SULFATE-MG SULF 17.5-3.13-1.6 GM/177ML PO SOLN: 1.0000 | Freq: Once | ORAL | 0 refills | Status: AC

## 2021-10-19 NOTE — Progress Notes (Addendum)
Gastroenterology Pre-Procedure Review  Request Date: 10/19/2021 Requesting Physician: Dr. Sherrie Sport, no previous TCS  PATIENT REVIEW QUESTIONS: The patient responded to the following health history questions as indicated:    1. Diabetes Melitis: no 2. Joint replacements in the past 12 months: no 3. Major health problems in the past 3 months: no 4. Has an artificial valve or MVP: no 5. Has a defibrillator: no 6. Has been advised in past to take antibiotics in advance of a procedure like teeth cleaning: no 7. Family history of colon cancer: no  8. Alcohol Use: yes, 6 pack a week 9. Illicit drug Use: no 10. History of sleep apnea: yes, but no CPAP  11. History of coronary artery or other vascular stents placed within the last 12 months: no 12. History of any prior anesthesia complications: no 13. Body mass index is 28.71 kg/m.    MEDICATIONS & ALLERGIES:    Patient reports the following regarding taking any blood thinners:   Plavix?  no Aspirin? no Coumadin? no Brilinta? no Xarelto? no Eliquis? no Pradaxa? no Savaysa? no Effient? no  Patient confirms/reports the following medications:  Current Outpatient Medications  Medication Sig Dispense Refill   amLODipine (NORVASC) 5 MG tablet Take 5 mg by mouth daily.     ARIPiprazole (ABILIFY) 5 MG tablet Take 5 mg by mouth every morning.     busPIRone (BUSPAR) 7.5 MG tablet daily at 6 (six) AM.     celecoxib (CELEBREX) 200 MG capsule Take 200 mg by mouth daily.     escitalopram (LEXAPRO) 10 MG tablet Take 10 mg by mouth daily.     fluticasone furoate-vilanterol (BREO ELLIPTA) 100-25 MCG/ACT AEPB 1 puff as needed.     PARoxetine (PAXIL) 20 MG tablet daily.     tadalafil (CIALIS) 5 MG tablet Take 5 mg by mouth as needed for erectile dysfunction.     venlafaxine (EFFEXOR) 37.5 MG tablet Take 37.5 mg by mouth 2 (two) times daily.     No current facility-administered medications for this visit.    Patient confirms/reports the following  allergies:  No Known Allergies  No orders of the defined types were placed in this encounter.   AUTHORIZATION INFORMATION Primary Insurance: [place],  [place] #: [id number],  Group #: [id number] Pre-Cert / Josem Kaufmann required: No, not required per Bernestine Amass / Auth #: [id number]  SCHEDULE INFORMATION: Procedure has been scheduled as follows:  Date: 11/15/2021, Time: 2:00  Location: [facility] with Dr. Abbey Chatters  This Gastroenterology Pre-Precedure Review Form is being routed to the following provider(s):  Neil Crouch, PA-C

## 2021-10-24 NOTE — Progress Notes (Signed)
Ok to schedule, propofol. ASA II.

## 2021-10-27 ENCOUNTER — Other Ambulatory Visit: Payer: Self-pay | Admitting: *Deleted

## 2021-10-27 NOTE — Progress Notes (Signed)
Spoke to [employer] at [facility].  She informed me that patients can have colonoscopies starting at age 49.  Ref#: [id number]

## 2021-11-15 ENCOUNTER — Encounter (HOSPITAL_COMMUNITY): Payer: Self-pay

## 2021-11-15 ENCOUNTER — Ambulatory Visit (HOSPITAL_COMMUNITY): Payer: BC Managed Care – PPO | Admitting: Anesthesiology

## 2021-11-15 ENCOUNTER — Other Ambulatory Visit: Payer: Self-pay

## 2021-11-15 ENCOUNTER — Ambulatory Visit (HOSPITAL_COMMUNITY)
Admission: RE | Admit: 2021-11-15 | Discharge: 2021-11-15 | Disposition: A | Discharge status: Home / Self Care | Payer: BC Managed Care – PPO | Attending: Internal Medicine | Admitting: Internal Medicine

## 2021-11-15 ENCOUNTER — Encounter (HOSPITAL_COMMUNITY)
Admission: RE | Disposition: A | Discharge status: Home / Self Care | Payer: Self-pay | Source: Home / Self Care | Attending: Internal Medicine

## 2021-11-15 DIAGNOSIS — K648 Other hemorrhoids: Secondary | ICD-10-CM | POA: Diagnosis not present

## 2021-11-15 DIAGNOSIS — Z1211 Encounter for screening for malignant neoplasm of colon: Secondary | ICD-10-CM | POA: Insufficient documentation

## 2021-11-15 DIAGNOSIS — D127 Benign neoplasm of rectosigmoid junction: Secondary | ICD-10-CM

## 2021-11-15 DIAGNOSIS — I10 Essential (primary) hypertension: Secondary | ICD-10-CM | POA: Insufficient documentation

## 2021-11-15 DIAGNOSIS — D125 Benign neoplasm of sigmoid colon: Secondary | ICD-10-CM | POA: Insufficient documentation

## 2021-11-15 DIAGNOSIS — K219 Gastro-esophageal reflux disease without esophagitis: Secondary | ICD-10-CM | POA: Diagnosis not present

## 2021-11-15 DIAGNOSIS — F17200 Nicotine dependence, unspecified, uncomplicated: Secondary | ICD-10-CM | POA: Diagnosis not present

## 2021-11-15 HISTORY — PX: POLYPECTOMY: SHX5525

## 2021-11-15 HISTORY — PX: COLONOSCOPY WITH PROPOFOL: SHX5780

## 2021-11-15 SURGERY — COLONOSCOPY WITH PROPOFOL
Anesthesia: General

## 2021-11-15 MED ORDER — LACTATED RINGERS IV SOLN: INTRAVENOUS | Status: DC | PRN

## 2021-11-15 MED ORDER — PROPOFOL 10 MG/ML IV BOLUS
INTRAVENOUS | Status: DC | PRN
  Administered 2021-11-15: 120 mg via INTRAVENOUS
  Administered 2021-11-15: 30 mg via INTRAVENOUS
  Administered 2021-11-15: 50 mg via INTRAVENOUS

## 2021-11-15 MED ORDER — LIDOCAINE HCL (CARDIAC) PF 100 MG/5ML IV SOSY
PREFILLED_SYRINGE | INTRAVENOUS | Status: DC | PRN
  Administered 2021-11-15: 50 mg via INTRATRACHEAL

## 2021-11-15 NOTE — Op Note (Signed)
[facility] Patient Name: Brian Gomez Procedure Date: 11/15/2021 1:16 PM MRN: [id number] Date of Birth: [birth year] Attending MD: Elon Alas. Abbey Chatters DO CSN: [id number] Age: 50 Admit Type: Outpatient Procedure:                Colonoscopy Indications:              Screening for colorectal malignant neoplasm Providers:                Elon Alas. Abbey Chatters, DO, Janeece Riggers, RN, Casimer Bilis, Technician Referring MD:              Medicines:                See the Anesthesia note for documentation of the                            administered medications Complications:            No immediate complications. Estimated Blood Loss:     Estimated blood loss was minimal. Procedure:                Pre-Anesthesia Assessment:                           - The anesthesia plan was to use monitored                            anesthesia care (MAC).                           After obtaining informed consent, the colonoscope                            was passed under direct vision. Throughout the                            procedure, the patient's blood pressure, pulse, and                            oxygen saturations were monitored continuously. The                            PCF-HQ190L ([id number]) scope was introduced through                            the anus and advanced to the the cecum, identified                            by appendiceal orifice and ileocecal valve. The                            colonoscopy was performed without difficulty. The                            patient tolerated the procedure well.  The quality                            of the bowel preparation was evaluated using the                            BBPS [facility] Bowel Preparation Scale) with scores                            of: Right Colon = 2 (minor amount of residual                            staining, small fragments of stool and/or opaque                            liquid, but  mucosa seen well), Transverse Colon = 2                            (minor amount of residual staining, small fragments                            of stool and/or opaque liquid, but mucosa seen                            well) and Left Colon = 2 (minor amount of residual                            staining, small fragments of stool and/or opaque                            liquid, but mucosa seen well). The total BBPS score                            equals 6. The quality of the bowel preparation was                            fair. Scope In: 1:28:35 PM Scope Out: 1:42:37 PM Scope Withdrawal Time: 0 hours 12 minutes 11 seconds  Total Procedure Duration: 0 hours 14 minutes 2 seconds  Findings:      The perianal and digital rectal examinations were normal.      Non-bleeding internal hemorrhoids were found during endoscopy.      A 8 mm polyp was found in the recto-sigmoid colon. The polyp was       sessile. The polyp was removed with a cold snare. Resection and       retrieval were complete.      A moderate amount of stool was found in the entire colon, making       visualization difficult. Lavage of the area was performed using copious       amounts of sterile water, resulting in clearance with fair visualization. Impression:               - Preparation of the colon was fair.                           -  Non-bleeding internal hemorrhoids.                           - One 8 mm polyp at the recto-sigmoid colon,                            removed with a cold snare. Resected and retrieved.                           - Stool in the entire examined colon. Moderate Sedation:      Per Anesthesia Care Recommendation:           - Patient has a contact number available for                            emergencies. The signs and symptoms of potential                            delayed complications were discussed with the                            patient. Return to normal activities tomorrow.                             Written discharge instructions were provided to the                            patient.                           - Resume previous diet.                           - Continue present medications.                           - Await pathology results.                           - Repeat colonoscopy in 5 years for surveillance.                           - Return to GI clinic PRN. Procedure Code(s):        --- Professional ---                           [id number], Colonoscopy, flexible; with removal of                            tumor(s), polyp(s), or other lesion(s) by snare                            technique Diagnosis Code(s):        --- Professional ---                           Z12.11, Encounter  for screening for malignant                            neoplasm of colon                           K64.8, Other hemorrhoids                           K63.5, Polyp of colon CPT copyright 2019 [employer]. All rights reserved. The codes documented in this report are preliminary and upon coder review may  be revised to meet current compliance requirements. Elon Alas. Abbey Chatters, DO [place] Abbey Chatters, DO 11/15/2021 1:45:25 PM This report has been signed electronically. Number of Addenda: 0

## 2021-11-15 NOTE — Discharge Instructions (Addendum)
  Colonoscopy Discharge Instructions  Read the instructions outlined below and refer to this sheet in the next few weeks. These discharge instructions provide you with general information on caring for yourself after you leave the hospital. Your doctor may also give you specific instructions. While your treatment has been planned according to the most current medical practices available, unavoidable complications occasionally occur.   ACTIVITY You may resume your regular activity, but move at a slower pace for the next 24 hours.  Take frequent rest periods for the next 24 hours.  Walking will help get rid of the air and reduce the bloated feeling in your belly (abdomen).  No driving for 24 hours (because of the medicine (anesthesia) used during the test).   Do not sign any important legal documents or operate any machinery for 24 hours (because of the anesthesia used during the test).  NUTRITION Drink plenty of fluids.  You may resume your normal diet as instructed by your doctor.  Begin with a light meal and progress to your normal diet. Heavy or fried foods are harder to digest and may make you feel sick to your stomach (nauseated).  Avoid alcoholic beverages for 24 hours or as instructed.  MEDICATIONS You may resume your normal medications unless your doctor tells you otherwise.  WHAT YOU CAN EXPECT TODAY Some feelings of bloating in the abdomen.  Passage of more gas than usual.  Spotting of blood in your stool or on the toilet paper.  IF YOU HAD POLYPS REMOVED DURING THE COLONOSCOPY: No aspirin products for 7 days or as instructed.  No alcohol for 7 days or as instructed.  Eat a soft diet for the next 24 hours.  FINDING OUT THE RESULTS OF YOUR TEST Not all test results are available during your visit. If your test results are not back during the visit, make an appointment with your caregiver to find out the results. Do not assume everything is normal if you have not heard from your  caregiver or the medical facility. It is important for you to follow up on all of your test results.  SEEK IMMEDIATE MEDICAL ATTENTION IF: You have more than a spotting of blood in your stool.  Your belly is swollen (abdominal distention).  You are nauseated or vomiting.  You have a temperature over 101.  You have abdominal pain or discomfort that is severe or gets worse throughout the day.   Your colonoscopy revealed 1 polyp(s) which I removed successfully. Await pathology results, my office will contact you. I recommend repeating colonoscopy in 5 years for surveillance purposes.    I hope you have a great rest of your week!  Elon Alas. Abbey Chatters, D.O. Gastroenterology and Hepatology [facility]

## 2021-11-15 NOTE — Anesthesia Preprocedure Evaluation (Signed)
Anesthesia Evaluation  Patient identified by MRN, date of birth, ID band Patient awake    Reviewed: Allergy & Precautions, H&P , NPO status , Patient's Chart, lab work & pertinent test results, reviewed documented beta blocker date and time   Airway Mallampati: II  TM Distance: >3 FB Neck ROM: full    Dental no notable dental hx.    Pulmonary neg pulmonary ROS, Current Smoker,    Pulmonary exam normal breath sounds clear to auscultation       Cardiovascular Exercise Tolerance: Good hypertension, negative cardio ROS   Rhythm:regular Rate:Normal     Neuro/Psych negative neurological ROS  negative psych ROS   GI/Hepatic Neg liver ROS, GERD  Medicated,  Endo/Other  negative endocrine ROS  Renal/GU negative Renal ROS  negative genitourinary   Musculoskeletal   Abdominal   Peds  Hematology negative hematology ROS (+)   Anesthesia Other Findings   Reproductive/Obstetrics negative OB ROS                             Anesthesia Physical Anesthesia Plan  ASA: 2  Anesthesia Plan: General   Post-op Pain Management:    Induction:   PONV Risk Score and Plan: Propofol infusion  Airway Management Planned:   Additional Equipment:   Intra-op Plan:   Post-operative Plan:   Informed Consent: I have reviewed the patients History and Physical, chart, labs and discussed the procedure including the risks, benefits and alternatives for the proposed anesthesia with the patient or authorized representative who has indicated his/her understanding and acceptance.     Dental Advisory Given  Plan Discussed with: CRNA  Anesthesia Plan Comments:         Anesthesia Quick Evaluation  

## 2021-11-15 NOTE — H&P (Signed)
Primary Care Physician:  Neale Burly, MD Primary Gastroenterologist:  Dr. Abbey Chatters  Pre-Procedure History & Physical: HPI:  Brian Gomez is a 50 y.o. male is here for a colonoscopy for colon cancer screening purposes.  Patient denies any family history of colorectal cancer.  No melena or hematochezia.  No abdominal pain or unintentional weight loss.  No change in bowel habits.  Overall feels well from a GI standpoint.  Past Medical History:  Diagnosis Date   GERD (gastroesophageal reflux disease)    Hypertension     History reviewed. No pertinent surgical history.  Prior to Admission medications   Medication Sig Start Date End Date Taking? Authorizing Provider  amLODipine (NORVASC) 5 MG tablet Take 5 mg by mouth daily.   Yes [provider]  ARIPiprazole (ABILIFY) 10 MG tablet Take 10 mg by mouth in the morning. 10/24/21  Yes [provider]  busPIRone (BUSPAR) 10 MG tablet Take 10 mg by mouth daily at 6 (six) AM. 11/30/20  Yes [provider]  celecoxib (CELEBREX) 200 MG capsule Take 200 mg by mouth in the morning. 08/02/20  Yes [provider]  cyclobenzaprine (FLEXERIL) 10 MG tablet Take 10 mg by mouth 3 (three) times daily as needed for muscle spasms.   Yes [provider]  escitalopram (LEXAPRO) 10 MG tablet Take 10 mg by mouth daily. 05/24/21  Yes [provider]  fluticasone furoate-vilanterol (BREO ELLIPTA) 100-25 MCG/ACT AEPB 1-2 puffs daily as needed (respiratory issues). 05/26/21  Yes [provider]  meloxicam (MOBIC) 15 MG tablet Take 15 mg by mouth daily as needed for pain. 09/12/21  Yes [provider]  PARoxetine (PAXIL) 20 MG tablet Take 20 mg by mouth daily. 11/02/20  Yes [provider]  propranolol (INDERAL) 10 MG tablet Take 10 mg by mouth 3 (three) times daily. 10/22/21  Yes [provider]  sertraline (ZOLOFT) 50 MG tablet Take 50 mg by mouth daily.   Yes [provider]   SUPREP BOWEL PREP KIT 17.5-3.13-1.6 GM/177ML SOLN Take 354 mLs by mouth as directed. 10/19/21  Yes [provider]  tadalafil (CIALIS) 5 MG tablet Take 5 mg by mouth as needed for erectile dysfunction.   Yes [provider]  venlafaxine (EFFEXOR) 37.5 MG tablet Take 37.5 mg by mouth daily.   Yes [provider]    Allergies as of 10/25/2021   (No Known Allergies)    Family History  Problem Relation Age of Onset   Bipolar disorder Brother    Post-traumatic stress disorder Brother    Colon cancer Neg Hx     Social History   Socioeconomic History   Marital status: Single    Spouse name: Not on file   Number of children: Not on file   Years of education: Not on file   Highest education level: Not on file  Occupational History   Not on file  Tobacco Use   Smoking status: Every Day    Packs/day: 0.50    Years: 33.00    Pack years: 16.50    Types: Cigarettes   Smokeless tobacco: Never  Vaping Use   Vaping Use: Never used  Substance and Sexual Activity   Alcohol use: Yes    Alcohol/week: 24.0 standard drinks    Types: 24 Cans of beer per week    Comment: 1 x per week    Drug use: Not Currently   Sexual activity: Yes    Birth control/protection: Condom  Other Topics Concern  Not on file  Social History Narrative   Not on file   Social Determinants of Health   Financial Resource Strain: Not on file  Food Insecurity: Not on file  Transportation Needs: Not on file  Physical Activity: Not on file  Stress: Not on file  Social Connections: Not on file  Intimate Partner Violence: Not on file    Review of Systems: See HPI, otherwise negative ROS  Physical Exam: Vital signs in last 24 hours: Temp:  [98.6 F (37 C)] 98.6 F (37 C) (11/29 1228) Pulse Rate:  [71] 71 (11/29 1228) Resp:  [19] 19 (11/29 1228) BP: (122)/(87) 122/87 (11/29 1228) SpO2:  [98 %] 98 % (11/29 1228) Weight:  [88.5 kg] 88.5 kg (11/29 1228)   General:   Alert,   Well-developed, well-nourished, pleasant and cooperative in NAD Head:  Normocephalic and atraumatic. Eyes:  Sclera clear, no icterus.   Conjunctiva pink. Ears:  Normal auditory acuity. Nose:  No deformity, discharge,  or lesions. Mouth:  No deformity or lesions, dentition normal. Neck:  Supple; no masses or thyromegaly. Lungs:  Clear throughout to auscultation.   No wheezes, crackles, or rhonchi. No acute distress. Heart:  Regular rate and rhythm; no murmurs, clicks, rubs,  or gallops. Abdomen:  Soft, nontender and nondistended. No masses, hepatosplenomegaly or hernias noted. Normal bowel sounds, without guarding, and without rebound.   Msk:  Symmetrical without gross deformities. Normal posture. Extremities:  Without clubbing or edema. Neurologic:  Alert and  oriented x4;  grossly normal neurologically. Skin:  Intact without significant lesions or rashes. Cervical Nodes:  No significant cervical adenopathy. Psych:  Alert and cooperative. Normal mood and affect.  Impression/Plan: Brian Gomez is here for a colonoscopy to be performed for colon cancer screening purposes.  The risks of the procedure including infection, bleed, or perforation as well as benefits, limitations, alternatives and imponderables have been reviewed with the patient. Questions have been answered. All parties agreeable.

## 2021-11-15 NOTE — Transfer of Care (Signed)
Immediate Anesthesia Transfer of Care Note  Patient: Brian Gomez  Procedure(s) Performed: COLONOSCOPY WITH PROPOFOL POLYPECTOMY  Patient Location: Endoscopy Unit  Anesthesia Type:General  Level of Consciousness: awake, alert  and oriented  Airway & Oxygen Therapy: Patient Spontanous Breathing  Post-op Assessment: Report given to RN and Post -op Vital signs reviewed and stable  Post vital signs: Reviewed and stable  Last Vitals:  Vitals Value Taken Time  BP    Temp    Pulse    Resp    SpO2      Last Pain:  Vitals:   11/15/21 1228  PainSc: 0-No pain      Patients Stated Pain Goal: 7 (06/38/68 [id number])  Complications: No notable events documented.

## 2021-11-15 NOTE — Anesthesia Postprocedure Evaluation (Signed)
Anesthesia Post Note  Patient: Bradlee Heitman  Procedure(s) Performed: COLONOSCOPY WITH PROPOFOL POLYPECTOMY  Patient location during evaluation: Phase II Anesthesia Type: General Level of consciousness: awake Pain management: pain level controlled Vital Signs Assessment: post-procedure vital signs reviewed and stable Respiratory status: spontaneous breathing and respiratory function stable Cardiovascular status: blood pressure returned to baseline and stable Postop Assessment: no headache and no apparent nausea or vomiting Anesthetic complications: no Comments: Late entry   No notable events documented.   Last Vitals:  Vitals:   11/15/21 1228 11/15/21 1346  BP: 122/87 100/71  Pulse: 71 81  Resp: 19 19  Temp: 37 C 36.5 C  SpO2: 98% 96%    Last Pain:  Vitals:   11/15/21 1346  TempSrc: Oral  PainSc: 0-No pain                 Louann Sjogren

## 2021-11-17 ENCOUNTER — Encounter (HOSPITAL_COMMUNITY): Payer: Self-pay | Admitting: Internal Medicine

## 2021-11-17 LAB — SURGICAL PATHOLOGY

## 2023-04-30 DIAGNOSIS — J4540 Moderate persistent asthma, uncomplicated: Secondary | ICD-10-CM | POA: Diagnosis not present

## 2023-04-30 DIAGNOSIS — L918 Other hypertrophic disorders of the skin: Secondary | ICD-10-CM | POA: Diagnosis not present

## 2023-04-30 DIAGNOSIS — Z6828 Body mass index (BMI) 28.0-28.9, adult: Secondary | ICD-10-CM | POA: Diagnosis not present

## 2023-04-30 DIAGNOSIS — I10 Essential (primary) hypertension: Secondary | ICD-10-CM | POA: Diagnosis not present

## 2023-04-30 DIAGNOSIS — M5459 Other low back pain: Secondary | ICD-10-CM | POA: Diagnosis not present

## 2023-05-16 DIAGNOSIS — Z5181 Encounter for therapeutic drug level monitoring: Secondary | ICD-10-CM | POA: Diagnosis not present

## 2023-05-16 DIAGNOSIS — Z79899 Other long term (current) drug therapy: Secondary | ICD-10-CM | POA: Diagnosis not present

## 2023-05-16 DIAGNOSIS — F3181 Bipolar II disorder: Secondary | ICD-10-CM | POA: Diagnosis not present

## 2023-05-16 DIAGNOSIS — F4312 Post-traumatic stress disorder, chronic: Secondary | ICD-10-CM | POA: Diagnosis not present

## 2023-05-16 DIAGNOSIS — F1520 Other stimulant dependence, uncomplicated: Secondary | ICD-10-CM | POA: Diagnosis not present

## 2023-05-16 DIAGNOSIS — F411 Generalized anxiety disorder: Secondary | ICD-10-CM | POA: Diagnosis not present

## 2023-08-30 DIAGNOSIS — Z6828 Body mass index (BMI) 28.0-28.9, adult: Secondary | ICD-10-CM | POA: Diagnosis not present

## 2023-08-30 DIAGNOSIS — J4540 Moderate persistent asthma, uncomplicated: Secondary | ICD-10-CM | POA: Diagnosis not present

## 2023-08-30 DIAGNOSIS — M5459 Other low back pain: Secondary | ICD-10-CM | POA: Diagnosis not present

## 2023-08-30 DIAGNOSIS — I10 Essential (primary) hypertension: Secondary | ICD-10-CM | POA: Diagnosis not present

## 2023-08-30 DIAGNOSIS — L918 Other hypertrophic disorders of the skin: Secondary | ICD-10-CM | POA: Diagnosis not present

## 2023-08-30 DIAGNOSIS — L0390 Cellulitis, unspecified: Secondary | ICD-10-CM | POA: Diagnosis not present

## 2023-12-10 DIAGNOSIS — F1411 Cocaine abuse, in remission: Secondary | ICD-10-CM | POA: Diagnosis not present

## 2023-12-18 DIAGNOSIS — F329 Major depressive disorder, single episode, unspecified: Secondary | ICD-10-CM | POA: Diagnosis not present

## 2024-01-03 DIAGNOSIS — Z6829 Body mass index (BMI) 29.0-29.9, adult: Secondary | ICD-10-CM | POA: Diagnosis not present

## 2024-01-03 DIAGNOSIS — Z0000 Encounter for general adult medical examination without abnormal findings: Secondary | ICD-10-CM | POA: Diagnosis not present
